# Patient Record
Sex: Male | Born: 1990 | Race: Black or African American | Hispanic: No | Marital: Married | Smoking: Current every day smoker
Health system: Southern US, Community
[De-identification: ages and names within clinical notes are randomized; demographics above are authoritative.]

## PROBLEM LIST (undated history)

## (undated) DIAGNOSIS — F419 Anxiety disorder, unspecified: Secondary | ICD-10-CM

## (undated) DIAGNOSIS — J40 Bronchitis, not specified as acute or chronic: Secondary | ICD-10-CM

## (undated) HISTORY — PX: HAND SURGERY: SHX662

## (undated) HISTORY — PX: TOE SURGERY: SHX1073

## (undated) HISTORY — PX: OTHER SURGICAL HISTORY: SHX169

---

## 2011-12-06 ENCOUNTER — Emergency Department (HOSPITAL_BASED_OUTPATIENT_CLINIC_OR_DEPARTMENT_OTHER)
Admission: EM | Admit: 2011-12-06 | Discharge: 2011-12-06 | Disposition: A | Discharge status: Home / Self Care | Payer: Self-pay | Attending: Emergency Medicine | Admitting: Emergency Medicine

## 2011-12-06 ENCOUNTER — Encounter (HOSPITAL_BASED_OUTPATIENT_CLINIC_OR_DEPARTMENT_OTHER): Payer: Self-pay | Admitting: *Deleted

## 2011-12-06 DIAGNOSIS — F17200 Nicotine dependence, unspecified, uncomplicated: Secondary | ICD-10-CM | POA: Insufficient documentation

## 2011-12-06 DIAGNOSIS — J069 Acute upper respiratory infection, unspecified: Secondary | ICD-10-CM

## 2011-12-06 DIAGNOSIS — R6883 Chills (without fever): Secondary | ICD-10-CM | POA: Insufficient documentation

## 2011-12-06 HISTORY — DX: Anxiety disorder, unspecified: F41.9

## 2011-12-06 HISTORY — DX: Bronchitis, not specified as acute or chronic: J40

## 2011-12-06 MED ORDER — HYDROCOD POLST-CHLORPHEN POLST 10-8 MG/5ML PO LQCR: 5.0000 mL | Freq: Two times a day (BID) | ORAL | Status: DC | PRN

## 2011-12-06 MED ORDER — AMOXICILLIN 500 MG PO CAPS: 500.0000 mg | ORAL_CAPSULE | Freq: Three times a day (TID) | ORAL | Status: AC

## 2011-12-06 NOTE — ED Notes (Signed)
Patient states he has had chills and sweats for the last 2-3 days.  States he recently has had sinus congestion.  Unknown if he has a fever.

## 2011-12-06 NOTE — Discharge Instructions (Signed)
Tussionex cough syrup as prescribed.  If not feeling better in 2-3 days, fill your prescription for amoxicillin.   Upper Respiratory Infection, Adult An upper respiratory infection (URI) is also sometimes known as the common cold. The upper respiratory tract includes the nose, sinuses, throat, trachea, and bronchi. Bronchi are the airways leading to the lungs. Most people improve within 1 week, but symptoms can last up to 2 weeks. A residual cough may last even longer.  CAUSES Many different viruses can infect the tissues lining the upper respiratory tract. The tissues become irritated and inflamed and often become very moist. Mucus production is also common. A cold is contagious. You can easily spread the virus to others by oral contact. This includes kissing, sharing a glass, coughing, or sneezing. Touching your mouth or nose and then touching a surface, which is then touched by another person, can also spread the virus. SYMPTOMS  Symptoms typically develop 1 to 3 days after you come in contact with a cold virus. Symptoms vary from person to person. They may include:  Runny nose.   Sneezing.   Nasal congestion.   Sinus irritation.   Sore throat.   Loss of voice (laryngitis).   Cough.   Fatigue.   Muscle aches.   Loss of appetite.   Headache.   Low-grade fever.  DIAGNOSIS  You might diagnose your own cold based on familiar symptoms, since most people get a cold 2 to 3 times a year. Your caregiver can confirm this based on your exam. Most importantly, your caregiver can check that your symptoms are not due to another disease such as strep throat, sinusitis, pneumonia, asthma, or epiglottitis. Blood tests, throat tests, and X-rays are not necessary to diagnose a common cold, but they may sometimes be helpful in excluding other more serious diseases. Your caregiver will decide if any further tests are required. RISKS AND COMPLICATIONS  You may be at risk for a more severe case of  the common cold if you smoke cigarettes, have chronic heart disease (such as heart failure) or lung disease (such as asthma), or if you have a weakened immune system. The very young and very old are also at risk for more serious infections. Bacterial sinusitis, middle ear infections, and bacterial pneumonia can complicate the common cold. The common cold can worsen asthma and chronic obstructive pulmonary disease (COPD). Sometimes, these complications can require emergency medical care and may be life-threatening. PREVENTION  The best way to protect against getting a cold is to practice good hygiene. Avoid oral or hand contact with people with cold symptoms. Wash your hands often if contact occurs. There is no clear evidence that vitamin C, vitamin E, echinacea, or exercise reduces the chance of developing a cold. However, it is always recommended to get plenty of rest and practice good nutrition. TREATMENT  Treatment is directed at relieving symptoms. There is no cure. Antibiotics are not effective, because the infection is caused by a virus, not by bacteria. Treatment may include:  Increased fluid intake. Sports drinks offer valuable electrolytes, sugars, and fluids.   Breathing heated mist or steam (vaporizer or shower).   Eating chicken soup or other clear broths, and maintaining good nutrition.   Getting plenty of rest.   Using gargles or lozenges for comfort.   Controlling fevers with ibuprofen or acetaminophen as directed by your caregiver.   Increasing usage of your inhaler if you have asthma.  Zinc gel and zinc lozenges, taken in the first 24 hours of  the common cold, can shorten the duration and lessen the severity of symptoms. Pain medicines may help with fever, muscle aches, and throat pain. A variety of non-prescription medicines are available to treat congestion and runny nose. Your caregiver can make recommendations and may suggest nasal or lung inhalers for other symptoms.  HOME  CARE INSTRUCTIONS   Only take over-the-counter or prescription medicines for pain, discomfort, or fever as directed by your caregiver.   Use a warm mist humidifier or inhale steam from a shower to increase air moisture. This may keep secretions moist and make it easier to breathe.   Drink enough water and fluids to keep your urine clear or pale yellow.   Rest as needed.   Return to work when your temperature has returned to normal or as your caregiver advises. You may need to stay home longer to avoid infecting others. You can also use a face mask and careful hand washing to prevent spread of the virus.  SEEK MEDICAL CARE IF:   After the first few days, you feel you are getting worse rather than better.   You need your caregiver's advice about medicines to control symptoms.   You develop chills, worsening shortness of breath, or brown or red sputum. These may be signs of pneumonia.   You develop yellow or brown nasal discharge or pain in the face, especially when you bend forward. These may be signs of sinusitis.   You develop a fever, swollen neck glands, pain with swallowing, or white areas in the back of your throat. These may be signs of strep throat.  SEEK IMMEDIATE MEDICAL CARE IF:   You have a fever.   You develop severe or persistent headache, ear pain, sinus pain, or chest pain.   You develop wheezing, a prolonged cough, cough up blood, or have a change in your usual mucus (if you have chronic lung disease).   You develop sore muscles or a stiff neck.  Document Released: 01/18/2001 Document Revised: 07/14/2011 Document Reviewed: 11/26/2010 [facility] Patient Information 2012 [place], [place].

## 2011-12-06 NOTE — ED Provider Notes (Signed)
History     CSN: [id number]  Arrival date & time 12/06/11  4098   First MD Initiated Contact with Patient 12/06/11 1119      Chief Complaint  Patient presents with  . Chills    (Consider location/radiation/quality/duration/timing/severity/associated sxs/prior treatment) Patient is a 21 y.o. male presenting with URI. The history is provided by the patient.  URI The primary symptoms include fever, fatigue and cough. Primary symptoms do not include nausea or vomiting. The current episode started 3 to 5 days ago. This is a new problem. The problem has been gradually worsening.  The onset of the illness is associated with exposure to sick contacts (kids at home with same). Symptoms associated with the illness include chills, plugged ear sensation, sinus pressure and congestion.    Past Medical History  Diagnosis Date  . Bronchitis   . Anxiety     Past Surgical History  Procedure Date  . Toe surgery   . Hyperspadis   . Hand surgery     No family history on file.  History  Substance Use Topics  . Smoking status: Current Everyday Smoker    Types: Cigarettes  . Smokeless tobacco: Not on file  . Alcohol Use: No      Review of Systems  Constitutional: Positive for fever, chills and fatigue.  HENT: Positive for congestion and sinus pressure.   Respiratory: Positive for cough.   Gastrointestinal: Negative for nausea and vomiting.  All other systems reviewed and are negative.    Allergies  Review of patient's allergies indicates no known allergies.  Home Medications  No current outpatient prescriptions on file.  BP 150/69  Pulse 83  Temp(Src) 98.2 F (36.8 C) (Oral)  Resp 20  Ht 6\' 1"  (1.854 m)  Wt 287 lb (130.182 kg)  BMI 37.86 kg/m2  SpO2 98%  Physical Exam  Nursing note and vitals reviewed. Constitutional: He is oriented to person, place, and time. He appears well-developed and well-nourished. No distress.  HENT:  Head: Normocephalic and atraumatic.    Right Ear: External ear normal.  Left Ear: External ear normal.  Mouth/Throat: Oropharynx is clear and moist.  Neck: Normal range of motion.  Cardiovascular: Normal rate and regular rhythm.   Pulmonary/Chest: Effort normal. No respiratory distress.  Abdominal: Soft. Bowel sounds are normal. He exhibits distension.  Musculoskeletal: Normal range of motion. He exhibits no edema.  Lymphadenopathy:    He has no cervical adenopathy.  Neurological: He is alert and oriented to person, place, and time.  Skin: Skin is warm and dry. He is not diaphoretic.    ED Course  Procedures (including critical care time)  Labs Reviewed - No data to display No results found.   No diagnosis found.    MDM          Geoffery Lyons, MD 12/06/11 [phone]

## 2011-12-15 ENCOUNTER — Encounter (HOSPITAL_BASED_OUTPATIENT_CLINIC_OR_DEPARTMENT_OTHER): Payer: Self-pay | Admitting: *Deleted

## 2011-12-15 ENCOUNTER — Emergency Department (HOSPITAL_BASED_OUTPATIENT_CLINIC_OR_DEPARTMENT_OTHER)
Admission: EM | Admit: 2011-12-15 | Discharge: 2011-12-15 | Disposition: A | Discharge status: Home / Self Care | Payer: Self-pay | Attending: Emergency Medicine | Admitting: Emergency Medicine

## 2011-12-15 ENCOUNTER — Emergency Department (INDEPENDENT_AMBULATORY_CARE_PROVIDER_SITE_OTHER): Payer: Self-pay

## 2011-12-15 DIAGNOSIS — N4340 Spermatocele of epididymis, unspecified: Secondary | ICD-10-CM | POA: Insufficient documentation

## 2011-12-15 DIAGNOSIS — N342 Other urethritis: Secondary | ICD-10-CM | POA: Insufficient documentation

## 2011-12-15 DIAGNOSIS — R300 Dysuria: Secondary | ICD-10-CM

## 2011-12-15 DIAGNOSIS — N509 Disorder of male genital organs, unspecified: Secondary | ICD-10-CM

## 2011-12-15 LAB — URINE MICROSCOPIC-ADD ON

## 2011-12-15 LAB — URINALYSIS, ROUTINE W REFLEX MICROSCOPIC
Glucose, UA: NEGATIVE mg/dL
Ketones, ur: NEGATIVE mg/dL
Protein, ur: NEGATIVE mg/dL
Urobilinogen, UA: 0.2 mg/dL (ref 0.0–1.0)

## 2011-12-15 MED ORDER — HYDROCODONE-ACETAMINOPHEN 5-500 MG PO TABS: 1.0000 | ORAL_TABLET | Freq: Four times a day (QID) | ORAL | Status: AC | PRN

## 2011-12-15 MED ORDER — HYDROCODONE-ACETAMINOPHEN 5-325 MG PO TABS
2.0000 | ORAL_TABLET | Freq: Once | ORAL | Status: AC
  Administered 2011-12-15: 2 via ORAL

## 2011-12-15 MED ORDER — AZITHROMYCIN 250 MG PO TABS
1000.0000 mg | ORAL_TABLET | Freq: Once | ORAL | Status: AC
  Administered 2011-12-15: 1000 mg via ORAL
  Filled 2011-12-15: qty 1
  Filled 2011-12-15: qty 3

## 2011-12-15 MED ORDER — HYDROCODONE-ACETAMINOPHEN 5-325 MG PO TABS
ORAL_TABLET | ORAL | Status: AC
  Administered 2011-12-15: 2 via ORAL
  Filled 2011-12-15: qty 2

## 2011-12-15 MED ORDER — CIPROFLOXACIN HCL 500 MG PO TABS: 500.0000 mg | ORAL_TABLET | Freq: Two times a day (BID) | ORAL | Status: AC

## 2011-12-15 MED ORDER — CEFTRIAXONE SODIUM 250 MG IJ SOLR
250.0000 mg | Freq: Once | INTRAMUSCULAR | Status: AC
  Administered 2011-12-15: 250 mg via INTRAMUSCULAR
  Filled 2011-12-15: qty 250

## 2011-12-15 NOTE — ED Notes (Signed)
Pt awoke this am with pain in genitals, which increases with urination, pt states that he does have pain in his lower back,

## 2011-12-15 NOTE — ED Notes (Signed)
Woke with hematuria and urgency.

## 2011-12-15 NOTE — ED Provider Notes (Signed)
History     CSN: [id number]  Arrival date & time 12/15/11  1924   First MD Initiated Contact with Patient 12/15/11 1937      No chief complaint on file.   (Consider location/radiation/quality/duration/timing/severity/associated sxs/prior treatment) HPI Comments: Pt is c/o hematuria and urgency and testicular pain that started this morning:pt denies any pain in abdomen or back:pt denies fever, nausea, vomiting or diarrhea:pt denies any penile discharge prior to the blood in urine noted today  The history is provided by the patient. No language interpreter was used.    Past Medical History  Diagnosis Date  . Bronchitis   . Anxiety     Past Surgical History  Procedure Date  . Toe surgery   . Hyperspadis   . Hand surgery     No family history on file.  History  Substance Use Topics  . Smoking status: Current Everyday Smoker    Types: Cigarettes  . Smokeless tobacco: Not on file  . Alcohol Use: No      Review of Systems  Constitutional: Negative.   Respiratory: Negative.   Cardiovascular: Negative.     Allergies  Review of patient's allergies indicates no known allergies.  Home Medications   Current Outpatient Rx  Name Route Sig Dispense Refill  . AMOXICILLIN 500 MG PO CAPS Oral Take 1 capsule (500 mg total) by mouth 3 (three) times daily. 21 capsule 0    BP 118/66  Pulse 92  Temp(Src) 99.5 F (37.5 C) (Oral)  Resp 20  SpO2 100%  Physical Exam  Nursing note and vitals reviewed. Constitutional: He appears well-developed and well-nourished.  Cardiovascular: Normal rate and regular rhythm.   Pulmonary/Chest: Effort normal and breath sounds normal.  Abdominal: Soft.  Genitourinary: Left testis shows tenderness.  Musculoskeletal: Normal range of motion.  Skin: Skin is dry.    ED Course  Procedures (including critical care time)  Labs Reviewed  URINALYSIS, ROUTINE W REFLEX MICROSCOPIC - Abnormal; Notable for the following:    APPearance CLOUDY (*)     Hgb urine dipstick LARGE (*)    Leukocytes, UA LARGE (*)    All other components within normal limits  URINE MICROSCOPIC-ADD ON  GC/CHLAMYDIA PROBE AMP, GENITAL   US Scrotum  12/15/2011  *RADIOLOGY REPORT*  Clinical Data: Left vestibular pain  SCROTAL ULTRASOUND Technique:  Complete ultrasound examination of the testicles, epididymis, and other scrotal structures was performed.  Color Doppler ultrasound was also utilized to evaluate blood flow to the testicles.  Comparison:  None.  Findings: Left testicle measures 4.7 x 1.9 x 3.6 cm.  The left testicle measures 4.4 x 2.1 x 3.1 cm. Testicular echotexture is homogeneous bilaterally.  No intratesticular mass lesion.  No microlithiasis.  2 mm epididymal cyst or spermatocele is seen in the left epididymal tissues.  The right epididymis is unremarkable.  There is no hydrocele on either side.  The venous plexus is slightly prominent bilaterally without overt evidence for varicocele.  IMPRESSION: Sonographically normal appearance of the testicles.  Color Doppler signal is symmetric within the testicular parenchyma.  Original Report Authenticated By: ERIC A. MANSELL, M.D.     1. Spermatocele   2. Urethritis       MDM  Discussed findings with pt:pt treated and tested for std here:educated pt on WUJ:WJXB send home with urology follow up and something for pain:considered kidney stone but pts pain is localized to scrotum:no flank or abdominal pain        Teressa Lower, NP 12/15/11 2122

## 2011-12-15 NOTE — Discharge Instructions (Signed)
Urethritis, Adult  Urethritis is an inflammation (soreness) of the urethra (the tube exiting from the bladder). It is often caused by germs that may be spread through sexual contact.  TREATMENT   Urethritis will usually respond to antibiotics. These are medications that kill germs. Take all the medicine given to you. You may feel better in a couple days, but TAKE ALL MEDICINE or the infection may not be completely cured and may become more difficult to treat. Response can generally be expected in 7 to 10 days. You may require additional treatment after more testing.  HOME CARE INSTRUCTIONS   Not have sex until the test results are known and treatment is completed.   Know that you may be asked to notify your sex partner when your final test results are back.   Finish all medications as prescribed.   Prevent sexually transmitted infections including AIDS. Practice safe sex. Use condoms.  SEEK MEDICAL CARE IF:    Your symptoms are not improved in 2 to 3 days.   Your symptoms are getting worse.   Your develop abdominal pain.   You develop joint pain.  SEEK IMMEDIATE MEDICAL CARE IF:    You have a fever.   You develop severe pain in the belly, back or side.   You develop repeated vomiting.  TEST RESULTS  Not all test results are available during your visit. If your test results are not back during the visit, make an appointment with your caregiver to find out the results. Do not assume everything is normal if you have not heard from your caregiver or the medical facility. It is important for you to follow-up on all of your test results.  Document Released: 01/18/2001 Document Revised: 07/14/2011 Document Reviewed: 08/10/2009  [employer] Patient Information 2012 [employer].

## 2011-12-15 NOTE — ED Provider Notes (Signed)
Medical screening examination/treatment/procedure(s) were performed by non-physician practitioner and as supervising physician I was immediately available for consultation/collaboration.  Toy Baker, MD 12/15/11 2212

## 2011-12-16 LAB — GC/CHLAMYDIA PROBE AMP, GENITAL: GC Probe Amp, Genital: NEGATIVE

## 2013-03-23 ENCOUNTER — Encounter (HOSPITAL_COMMUNITY): Payer: Self-pay | Admitting: Emergency Medicine

## 2013-03-23 ENCOUNTER — Emergency Department (HOSPITAL_COMMUNITY)
Admission: EM | Admit: 2013-03-23 | Discharge: 2013-03-23 | Discharge status: Against Medical Advice | Payer: Self-pay | Attending: Emergency Medicine | Admitting: Emergency Medicine

## 2013-03-23 DIAGNOSIS — Y9389 Activity, other specified: Secondary | ICD-10-CM | POA: Insufficient documentation

## 2013-03-23 DIAGNOSIS — F17200 Nicotine dependence, unspecified, uncomplicated: Secondary | ICD-10-CM | POA: Insufficient documentation

## 2013-03-23 DIAGNOSIS — Y92410 Unspecified street and highway as the place of occurrence of the external cause: Secondary | ICD-10-CM | POA: Insufficient documentation

## 2013-03-23 DIAGNOSIS — S79919A Unspecified injury of unspecified hip, initial encounter: Secondary | ICD-10-CM | POA: Insufficient documentation
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
## 2014-04-04 ENCOUNTER — Encounter (HOSPITAL_BASED_OUTPATIENT_CLINIC_OR_DEPARTMENT_OTHER): Payer: Self-pay | Admitting: Emergency Medicine

## 2014-04-04 ENCOUNTER — Emergency Department (HOSPITAL_BASED_OUTPATIENT_CLINIC_OR_DEPARTMENT_OTHER)
Admission: EM | Admit: 2014-04-04 | Discharge: 2014-04-04 | Disposition: A | Discharge status: Home / Self Care | Payer: Self-pay | Attending: Emergency Medicine | Admitting: Emergency Medicine

## 2014-04-04 DIAGNOSIS — Z8709 Personal history of other diseases of the respiratory system: Secondary | ICD-10-CM | POA: Insufficient documentation

## 2014-04-04 DIAGNOSIS — F17200 Nicotine dependence, unspecified, uncomplicated: Secondary | ICD-10-CM | POA: Insufficient documentation

## 2014-04-04 DIAGNOSIS — R630 Anorexia: Secondary | ICD-10-CM | POA: Insufficient documentation

## 2014-04-04 DIAGNOSIS — Z8659 Personal history of other mental and behavioral disorders: Secondary | ICD-10-CM | POA: Insufficient documentation

## 2014-04-04 DIAGNOSIS — R42 Dizziness and giddiness: Secondary | ICD-10-CM | POA: Insufficient documentation

## 2014-04-04 DIAGNOSIS — R51 Headache: Secondary | ICD-10-CM | POA: Insufficient documentation

## 2014-04-04 LAB — CBG MONITORING, ED: Glucose-Capillary: 89 mg/dL (ref 70–99)

## 2014-04-04 NOTE — ED Provider Notes (Signed)
CSN: [id number]     Arrival date & time 04/04/14  6195 History  This chart was scribed for Elwin Mocha, MD by Bronson Curb, ED Scribe. This patient was seen in room MH08/MH08 and the patient's care was started at 9:01 PM.    Chief Complaint  Patient presents with  . Dizziness      Patient is a 23 y.o. male presenting with dizziness. The history is provided by the patient. No language interpreter was used.  Dizziness Description: "woozy" Severity:  Moderate Onset quality:  Sudden Progression:  Improving Chronicity:  New Relieved by:  Nothing Ineffective treatments:  Lying down Associated symptoms: headaches   Associated symptoms: no blood in stool, no nausea, no tinnitus, no vision changes and no vomiting     HPI Comments: Samuel Weeks is a 23 y.o. male who presents to the Emergency Department complaining of dizziness onset this morning. Patient states he woke up this morning, and felt "woozy" throughout the day. He reports he did not eat until 1 PM and took a 4 hour nap, however, he states his symptoms still persist. Patient states he did keep late hours the night before and had 1 beer. Patient also reports tenderness behind his right ear and reports prior swelling to the area that has since resolved. Patient states he stays hydrated. He denies fever, tinnitus, sore throat, rhinorrhea, visual disturbances, numbness/weakness of extremities, bowel/bladder symptoms, or abdominal pain. Patient has history of bronchitis and anxiety. He also reports family history of DM.  Patient is not established with a PCP   Past Medical History  Diagnosis Date  . Bronchitis   . Anxiety    Past Surgical History  Procedure Laterality Date  . Toe surgery    . Hyperspadis    . Hand surgery     No family history on file. History  Substance Use Topics  . Smoking status: Current Every Day Smoker    Types: Cigarettes  . Smokeless tobacco: Not on file  . Alcohol Use: Yes     Comment: occ     Review of Systems  Constitutional: Negative for fever and chills.  HENT: Negative for tinnitus.   Gastrointestinal: Negative for nausea, vomiting and blood in stool.  Neurological: Positive for dizziness and headaches.  All other systems reviewed and are negative.     Allergies  Review of patient's allergies indicates no known allergies.  Home Medications   Prior to Admission medications   Not on File   Triage Vitals: BP 133/92  Pulse 70  Temp(Src) 97.8 F (36.6 C) (Oral)  Ht  (1.854 m)  Wt 285 lb (129.275 kg)  BMI 37.61 kg/m2  SpO2 100%  Physical Exam  Nursing note and vitals reviewed. Constitutional: He is oriented to person, place, and time. He appears well-developed and well-nourished. No distress.  HENT:  Head: Normocephalic and atraumatic.  Right Ear: Hearing normal.  Left Ear: Hearing normal.  Nose: Nose normal.  Mouth/Throat: Oropharynx is clear and moist and mucous membranes are normal.  Eyes: Conjunctivae and EOM are normal. Pupils are equal, round, and reactive to light.  Neck: Normal range of motion. Neck supple.  Cardiovascular: Regular rhythm, S1 normal and S2 normal.  Exam reveals no gallop and no friction rub.   No murmur heard. Pulmonary/Chest: Effort normal and breath sounds normal. No respiratory distress. He exhibits no tenderness.  Abdominal: Soft. Normal appearance and bowel sounds are normal. There is no hepatosplenomegaly. There is no tenderness. There is no rebound, no  guarding, no tenderness at McBurney's point and negative Murphy's sign. No hernia.  Musculoskeletal: Normal range of motion.  Neurological: He is alert and oriented to person, place, and time. He has normal strength. No cranial nerve deficit or sensory deficit. Coordination normal. GCS eye subscore is 4. GCS verbal subscore is 5. GCS motor subscore is 6.  Skin: Skin is warm, dry and intact. No rash noted. No cyanosis.  Psychiatric: He has a normal mood and affect. His  speech is normal and behavior is normal. Thought content normal.    ED Course  Procedures (including critical care time)  DIAGNOSTIC STUDIES: Oxygen Saturation is 100% on room air, normal by my interpretation.    COORDINATION OF CARE: 9:13 PM- Pt advised of plan for treatment and pt agrees.   Labs Review Labs Reviewed - No data to display  Imaging Review No results found.   EKG Interpretation None      MDM   Final diagnoses:  Dizziness    23 year old male here with dizziness. Constant all day. Slept for 4 hours without relief. No fevers, vomiting, diarrhea. Decreased appetite today.  Exam benign. No medical problems. Offered labs, fluids the patient does not want to stay he would like to leave. Given resource guide for followup. Instructed to followup with PCP if problems persist. Stable for discharge  I personally performed the services described in this documentation, which was scribed in my presence. The recorded information has been reviewed and is accurate.     Elwin Mocha, MD 04/04/14 2325

## 2014-04-04 NOTE — Discharge Instructions (Signed)

## 2014-04-04 NOTE — ED Notes (Signed)
MD at bedside. 

## 2014-04-04 NOTE — ED Notes (Signed)
Dizziness since this am. Headache.

## 2014-09-26 ENCOUNTER — Emergency Department (HOSPITAL_BASED_OUTPATIENT_CLINIC_OR_DEPARTMENT_OTHER)
Admission: EM | Admit: 2014-09-26 | Discharge: 2014-09-26 | Disposition: A | Discharge status: Home / Self Care | Payer: Self-pay | Attending: Emergency Medicine | Admitting: Emergency Medicine

## 2014-09-26 ENCOUNTER — Encounter (HOSPITAL_BASED_OUTPATIENT_CLINIC_OR_DEPARTMENT_OTHER): Payer: Self-pay

## 2014-09-26 DIAGNOSIS — Z720 Tobacco use: Secondary | ICD-10-CM | POA: Insufficient documentation

## 2014-09-26 DIAGNOSIS — Z8659 Personal history of other mental and behavioral disorders: Secondary | ICD-10-CM | POA: Insufficient documentation

## 2014-09-26 DIAGNOSIS — Z8709 Personal history of other diseases of the respiratory system: Secondary | ICD-10-CM | POA: Insufficient documentation

## 2014-09-26 DIAGNOSIS — M5417 Radiculopathy, lumbosacral region: Secondary | ICD-10-CM | POA: Insufficient documentation

## 2014-09-26 NOTE — ED Notes (Signed)
Back pain that started yesterday while lying in bed.  [facility] but did not fill it.

## 2014-09-26 NOTE — ED Provider Notes (Signed)
CSN: [id number]     Arrival date & time 09/26/14  1652 History   First MD Initiated Contact with Patient 09/26/14 1721     Chief Complaint  Patient presents with  . Back Pain     (Consider location/radiation/quality/duration/timing/severity/associated sxs/prior Treatment) HPI Comments: Patient is a 24 year old male who presents with complaints of low back pain. He states he was lying in bed yesterday evening and reached for an object, causing him to feel a pull in his back. Since that time he has been uncomfortable and feels as though his back is "crooked". He describes radiation into his right buttock and thigh but denies any weakness or numbness. He denies any bowel or bladder complaints.  Patient is a 24 y.o. male presenting with back pain. The history is provided by the patient.  Back Pain Location:  Lumbar spine Quality:  Stabbing Radiates to:  R posterior upper leg Pain severity:  Moderate Onset quality:  Sudden Duration:  1 day Timing:  Constant Progression:  Unchanged Chronicity:  New Context: not falling   Relieved by:  Nothing Worsened by:  Bending, movement and palpation Ineffective treatments:  None tried   Past Medical History  Diagnosis Date  . Bronchitis   . Anxiety    Past Surgical History  Procedure Laterality Date  . Toe surgery    . Hyperspadis    . Hand surgery     No family history on file. History  Substance Use Topics  . Smoking status: Current Every Day Smoker    Types: Cigarettes  . Smokeless tobacco: Not on file  . Alcohol Use: Yes     Comment: occ    Review of Systems  Musculoskeletal: Positive for back pain.  All other systems reviewed and are negative.     Allergies  Review of patient's allergies indicates no known allergies.  Home Medications   Prior to Admission medications   Not on File   BP 142/80 mmHg  Pulse 99  Temp(Src) 99.8 F (37.7 C) (Oral)  Resp 18  Ht  (1.854 m)  Wt 312 lb (141.522 kg)  BMI 41.17  kg/m2  SpO2 98% Physical Exam  Constitutional: He is oriented to person, place, and time. He appears well-developed and well-nourished. No distress.  HENT:  Head: Normocephalic and atraumatic.  Neck: Normal range of motion. Neck supple.  Musculoskeletal:  There is tenderness to palpation in the soft tissues of the right lumbar region. There is no bony tenderness and no step-off.  Neurological: He is alert and oriented to person, place, and time.  DTRs are trace and symmetrical in the bilateral lower extremities. Strength is 5 out of 5 and he is able to ambulate on his heels and toes without difficulty.  Skin: Skin is warm and dry. He is not diaphoretic.  Nursing note and vitals reviewed.   ED Course  Procedures (including critical care time) Labs Review Labs Reviewed - No data to display  Imaging Review No results found.   EKG Interpretation None      MDM   Final diagnoses:  None    Patient presents with complaints of radicular low back pain that started yesterday evening. He is neurologically intact and I see no red flags that warrant emergent imaging. He was seen earlier today at [facility], however was unsatisfied with the treatment he was provided there. He was prescribed prednisone and Valium, however has not filled these. In discussion with the patient, he will fill the prescriptions and  give this time. He is to follow-up with his primary Dr. if not improving in the next week, and return to the ER if his symptoms substantially worsen or change. He was offered pain medication here in the ER, however has declined.    Geoffery Lyonsouglas Pietrina Jagodzinski, MD 09/26/14 [phone]

## 2014-09-26 NOTE — Discharge Instructions (Signed)
Prednisone as prescribed previously. Valium as prescribed previously.  Follow-up with your primary Dr. if not improving in the next week, and return to the ER if your symptoms substantially worsen or change.   Lumbosacral Radiculopathy Lumbosacral radiculopathy is a pinched nerve or nerves in the low back (lumbosacral area). When this happens you may have weakness in your legs and may not be able to stand on your toes. You may have pain going down into your legs. There may be difficulties with walking normally. There are many causes of this problem. Sometimes this may happen from an injury, or simply from arthritis or boney problems. It may also be caused by other illnesses such as diabetes. If there is no improvement after treatment, further studies may be done to find the exact cause. DIAGNOSIS  X-rays may be needed if the problems become long standing. Electromyograms may be done. This study is one in which the working of nerves and muscles is studied. HOME CARE INSTRUCTIONS   Applications of ice packs may be helpful. Ice can be used in a plastic bag with a towel around it to prevent frostbite to skin. This may be used every 2 hours for 20 to 30 minutes, or as needed, while awake, or as directed by your caregiver.  Only take over-the-counter or prescription medicines for pain, discomfort, or fever as directed by your caregiver.  If physical therapy was prescribed, follow your caregiver's directions. SEEK IMMEDIATE MEDICAL CARE IF:   You have pain not controlled with medications.  You seem to be getting worse rather than better.  You develop increasing weakness in your legs.  You develop loss of bowel or bladder control.  You have difficulty with walking or balance, or develop clumsiness in the use of your legs.  You have a fever. MAKE SURE YOU:   Understand these instructions.  Will watch your condition.  Will get help right away if you are not doing well or get worse. Document  Released: 07/25/2005 Document Revised: 10/17/2011 Document Reviewed: 03/14/2008 [facility] Patient Information 2015 [place], [place]. This information is not intended to replace advice given to you by your health care provider. Make sure you discuss any questions you have with your health care provider.

## 2014-09-26 NOTE — ED Notes (Signed)
[facility] given rx but has not filled the medication. Reports pain has continued and he feels like it needs to be xrayed.

## 2015-04-18 ENCOUNTER — Encounter (HOSPITAL_BASED_OUTPATIENT_CLINIC_OR_DEPARTMENT_OTHER): Payer: Self-pay | Admitting: Emergency Medicine

## 2015-04-18 ENCOUNTER — Emergency Department (HOSPITAL_BASED_OUTPATIENT_CLINIC_OR_DEPARTMENT_OTHER)
Admission: EM | Admit: 2015-04-18 | Discharge: 2015-04-18 | Disposition: A | Discharge status: Home / Self Care | Payer: Self-pay | Attending: Emergency Medicine | Admitting: Emergency Medicine

## 2015-04-18 DIAGNOSIS — Z720 Tobacco use: Secondary | ICD-10-CM | POA: Insufficient documentation

## 2015-04-18 DIAGNOSIS — Z8709 Personal history of other diseases of the respiratory system: Secondary | ICD-10-CM | POA: Insufficient documentation

## 2015-04-18 DIAGNOSIS — Z8659 Personal history of other mental and behavioral disorders: Secondary | ICD-10-CM | POA: Insufficient documentation

## 2015-04-18 DIAGNOSIS — R229 Localized swelling, mass and lump, unspecified: Secondary | ICD-10-CM

## 2015-04-18 DIAGNOSIS — R220 Localized swelling, mass and lump, head: Secondary | ICD-10-CM | POA: Insufficient documentation

## 2015-04-18 NOTE — Discharge Instructions (Signed)
1 to 2 tablets of 25 mg Benadryl pills every 4-6 hours as needed to a maximum of 300 mg per day.   Also apply ice to the affected area.   Do not hesitate to return to the emergency room for any new, worsening or concerning symptoms.  Please obtain primary care using resource guide below. Let them know that you were seen in the emergency room and that they will need to obtain records for further outpatient management.    Emergency Department Resource Guide 1) Find a Doctor and Pay Out of Pocket Although you won't have to find out who is covered by your insurance plan, it is a good idea to ask around and get recommendations. You will then need to call the office and see if the doctor you have chosen will accept you as a new patient and what types of options they offer for patients who are self-pay. Some doctors offer discounts or will set up payment plans for their patients who do not have insurance, but you will need to ask so you aren't surprised when you get to your appointment.  2) Contact Your Local Health Department Not all health departments have doctors that can see patients for sick visits, but many do, so it is worth a call to see if yours does. If you don't know where your local health department is, you can check in your phone book. The CDC also has a tool to help you locate your state's health department, and many state websites also have listings of all of their local health departments.  3) Find a Walk-in Clinic If your illness is not likely to be very severe or complicated, you may want to try a walk in clinic. These are popping up all over the country in pharmacies, drugstores, and shopping centers. They're usually staffed by nurse practitioners or physician assistants that have been trained to treat common illnesses and complaints. They're usually fairly quick and inexpensive. However, if you have serious medical issues or chronic medical problems, these are probably not your best  option.  No Primary Care Doctor: - Call Health Connect at  [phone] - they can help you locate a primary care doctor that  accepts your insurance, provides certain services, etc. - Physician Referral Service- [phone]  Chronic Pain Problems: Organization         Address  Phone   Notes  [facility]  [phone] Patients need to be referred by their primary care doctor.   Medication Assistance: Organization         Address  Phone   Notes  [facility] [address] [place] [place], [place] [address] [phone] --Must be a resident of [facility] -- Must have NO insurance coverage whatsoever (no Medicaid/ Medicare, etc.) -- The pt. MUST have a primary care doctor that directs their care regularly and follows them in the community   [facility]  [phone]   [employer]  [phone]    Agencies that provide inexpensive medical care: Organization         Address  Phone   Notes  [facility]  [phone]   [facility]    [phone]   [facility] [address] [place], [place] [address] [phone]   [facility] [address] [place] [place] [phone]   Planned Parenthood    [phone]   [facility]    [phone]   [facility]  [address], [place] Phone:  [phone], Fax:  [phone] Hours of Operation:  9 am - 6 pm, M-F.  Also accepts Medicaid/Medicare and self-pay.  [facility]  [address], [place] Phone: [phone], Fax: [phone]. Hours of Operation:  8:30 am - 5:30 pm, M-F.  Also accepts Medicaid and self-pay.  [facility] [place] [address], [place] Phone: [phone]   [facility] [address] Natasha Bence [place], [place] [phone], Ext. [phone] Mondays & Thursdays: 7-9 AM.  First 15  patients are seen on a first come, first serve basis.    Medicaid-accepting [facility] Providers:  Organization         Address  Phone   Notes  [facility] [address], [place] [phone] Also accepts self-pay patients.  [facility] [address] Laurell Josephs [place], [place]  [phone]   [facility] [address], [place] [phone]   [facility] [address], [place] [phone]   Renaye Rakers [address], [place]   [phone] Only accepts [place] Access [place] patients after they have their name applied to their card.   Self-Pay (no insurance) in [facility]:  Organization         Address  Phone   Notes  Sickle Cell Patients, [facility] [address] [address] [place], [place] [phone]   [facility] [address] [place], [place] [phone]   [facility] [place]  [address], [place] [phone]   [facility]/Dr. Osei-Bonsu  [address], [place] or [address], [place] [phone] Phone number for both [place] and [place] locations is the same.  [facility] [address] [place] [phone]   [facility] [address] [place] or [address] [phone]   [facility] [address], [place] [phone], phone; [phone], fax Sees patients 1st and 3rd Saturday of every month.  Must not qualify for public or private insurance (i.e. Medicaid, Medicare, [place] Health Choice, Veterans' Benefits)  Household income should be no more than 200% of the poverty level The clinic cannot treat you if you are pregnant or think you are pregnant  Sexually transmitted diseases are not treated at the clinic.    Dental  Care: Organization         Address  Phone  Notes  [facility] [address] [place], [place] [phone] Accepts children up to age 47 who are enrolled in [place] or [place] Health Choice; pregnant women with a Medicaid card; and children who have applied for Medicaid or [place] Health Choice, but were declined, whose parents can pay a reduced fee at time of service.  [facility]  [address], [place] [phone] Accepts children up to age 59 who are enrolled in [place] or [place] Health Choice; pregnant women with a Medicaid card; and children who have applied for Medicaid or [place] Health Choice, but were declined, whose parents can pay a reduced fee at time of service.  [facility]  [address], [place] [phone]) [phone] Patients are seen by appointment only. Walk-ins are not accepted. [facility] will see patients 25 years of age and older. Monday - Tuesday (8am-5pm) Most Wednesdays (8:30-5pm) $30 per visit, cash only  [facility]  [address], [facility] [place] [phone]) [phone] Patients are seen by appointment only. Walk-ins are not accepted. [facility] will see patients 25 years of age and older. One Wednesday Evening (Monthly: Volunteer Based).  $30 per visit, cash only  [facility]  [phone]) [phone] for adults; Children under age 254, call Graduate Pediatric Dentistry at [phone]) [phone]. Children aged 374-14, please call [phone]) [phone] to request a pediatric application.  Dental services are provided in all areas of dental care including fillings, crowns and bridges, complete and partial dentures, implants, gum treatment, root canals, and extractions. Preventive care is also provided. Treatment is provided to both adults and children. Patients are selected via a lottery and there is often a waiting list.   [facility] [address], [place]  [phone]) [phone] [email or web address]   [facility] [address], [place], [place] [phone], Ext. [phone] Second and Fourth Thursday of each month, opens at 6:30 AM; Clinic ends at 9 AM.  Patients are seen on a first-come first-served basis, and a limited number are seen during each clinic.   [facility]  [address] [place] [address], [place], [place] [phone]) [phone]   Eligibility Requirements You must have lived in [place], [place], or [place] counties for at least the last three months.   You cannot be eligible for state or federal sponsored [place] Cityhealthcare insurance, including CIGNAVeterans Administration, [place]icaid, or [employer] EntertainmentMedicare.   You generally cannot be eligible for healthcare insurance through your employer.    How to apply: Eligibility screenings are held every Tuesday and Wednesday afternoon from 1:00 pm until 4:00 pm. You do not need an appointment for the interview!  [facility] [address], [place], [place] [phone]   [facility]  [phone]   [facility]  [phone]   [facility]  [phone]    Behavioral Health Resources in the Community: Intensive Outpatient Programs Organization         Address  Phone  Notes  [facility] [address], [place], [place] [phone]   [facility] [address], [place], [place] [phone]   [facility] [address], [place], [place]  [phone]   [facility] [address],  [place], [place] [phone] or [phone]   Substance Abuse Resources Organization         Address  Phone  Notes  [facility]  [phone]   [facility]  [phone]   [facility]  [phone]   [facility]  [phone]   Residential & Outpatient Substance Abuse Program  [phone]    Psychological Services Organization         Address  Phone  Notes  [facility]  [phone]- [phone]   [facility]  [phone]- [phone]   [facility] [address], [place] [phone] or [phone]    Mobile Crisis Teams Organization         Address  Phone  Notes  [facility], Mobile Crisis Care Unit  [phone]   [facility]  [address] Ginette [place], [place] [phone]   [facility] [address] [place] [phone]    Self-Help/Support Groups Organization         Address  Phone             Notes  [facility] [place] - variety of support groups  336- I7437963 Call for more information  Narcotics Anonymous (NA), [facility] [address], [employer] [place]  2 meetings at this location   Statistician         Address  Phone  Notes  [facility] [address],    [place]  [phone]   [facility]  [address] [place] [address], [place], [place] [phone]   [facility] [address] [place], [place] [phone] Admissions: 8am-3pm M-F  [facility] [address]    [place], [place] [phone]   [facility] [address] [place], [place], [place] [phone]   [facility] [address]  [place], [place] [phone]   [facility] - Intensive Outpatient [address], [place], [place] [phone]   [facility] ([facility] [address] [place].,  [place], [place] [phone] or [phone]   [facility] ([facility]) [address], [place], [place] [phone] Accepts Medicaid  [facility] [address],  [place] [phone] Substance Abuse/Addiction Treatment   [facility] Organization         Address  Phone  Notes  [facility]  [phone]   Angie Fava, PhD [address] Ervin Knack [place], [place]   [phone] or [phone]   [facility]al   [address] [place], [place] [phone]   [facility] [address] [place], [place] [phone] Insurance/Medicaid/sponsorship through [facility] [address]                                    [place], [place] [phone] Therapy/tele-psych/case  [facility] [address], [place] [phone]    Dr. Lolly Mustache  [phone]   [facility]  [facility] Dept. 1) [address], [place] 2) [address] [place] 3)  [address], [place] [phone]  [phone]   [facility] Child Abuse Hotline [phone] or 2694474601 (After Hours)

## 2015-04-18 NOTE — ED Notes (Signed)
Patient reports that he no longer wants to stay and refuses any further vitals or care. Would like to be d/c'd up set that he had to wait so long.

## 2015-04-18 NOTE — ED Provider Notes (Signed)
CSN: [id number]     Arrival date & time 04/18/15  1221 History   First MD Initiated Contact with Patient 04/18/15 1410     Chief Complaint  Patient presents with  . Facial Swelling     (Consider location/radiation/quality/duration/timing/severity/associated sxs/prior Treatment) HPI   Blood pressure 132/68, pulse 77, temperature 98.4 F (36.9 C), temperature source Oral, resp. rate 16, height 6\' 1"  (1.854 m), weight 305 lb (138.347 kg), SpO2 98 %.  Samuel Weeks is a 24 y.o. male complaining of swelling to left side of 4 head which she noticed 3 days ago. States that swelling has improved and this is the best it has been (note that this contradicts triage note) patient has been taking Benadryl and took an amoxicillin which is left over from his mother. States that the area is pruritic but not painful. He states that there  was some slight swelling to the eyelid. He denies change in vision, pain with eye movement, fever, chills.  Past Medical History  Diagnosis Date  . Bronchitis   . Anxiety    Past Surgical History  Procedure Laterality Date  . Toe surgery    . Hyperspadis    . Hand surgery     No family history on file. Social History  Substance Use Topics  . Smoking status: Current Every Day Smoker    Types: Cigarettes  . Smokeless tobacco: None  . Alcohol Use: Yes     Comment: occ    Review of Systems  10 systems reviewed and found to be negative, except as noted in the HPI.   Allergies  Review of patient's allergies indicates no known allergies.  Home Medications   Prior to Admission medications   Not on File   BP 132/68 mmHg  Pulse 77  Temp(Src) 98.4 F (36.9 C) (Oral)  Resp 16  Ht 6\' 1"  (1.854 m)  Wt 305 lb (138.347 kg)  BMI 40.25 kg/m2  SpO2 98% Physical Exam  Constitutional: He is oriented to person, place, and time. He appears well-developed and well-nourished. No distress.  HENT:  Head: Normocephalic.  Mouth/Throat: Oropharynx is clear and  moist.  No appreciable swelling to the forehead. No warmth tenderness to palpation  Eyes: Conjunctivae and EOM are normal. Pupils are equal, round, and reactive to light.  Cardiovascular: Normal rate, regular rhythm and intact distal pulses.   Pulmonary/Chest: Effort normal and breath sounds normal. No stridor. No respiratory distress. He has no wheezes. He has no rales. He exhibits no tenderness.  Abdominal: Soft. Bowel sounds are normal. There is no tenderness. There is no rebound and no guarding.  Musculoskeletal: Normal range of motion. He exhibits no edema or tenderness.  Neurological: He is alert and oriented to person, place, and time.  Psychiatric: He has a normal mood and affect.  Nursing note and vitals reviewed.   ED Course  Procedures (including critical care time) Labs Review Labs Reviewed - No data to display  Imaging Review No results found. I have personally reviewed and evaluated these images and lab results as part of my medical decision-making.   EKG Interpretation None      MDM   Final diagnoses:  Localized soft tissue swelling    Filed Vitals:   04/18/15 1227  BP: 132/68  Pulse: 77  Temp: 98.4 F (36.9 C)  TempSrc: Oral  Resp: 16  Height: 6\' 1"  (1.854 m)  Weight: 305 lb (138.347 kg)  SpO2: 98%    Samuel Weeks is a pleasant 23  y.o. male presenting with improving swelling to 4 head. Physical exam is without abnormality. Patient has normal ocular exam, I doubt this is a periorbital cellulitis. Encouraged patient to use Benadryl and apply ice to the affected area  Evaluation does not show pathology that would require ongoing emergent intervention or inpatient treatment. Pt is hemodynamically stable and mentating appropriately. Discussed findings and plan with patient/guardian, who agrees with care plan. All questions answered. Return precautions discussed and outpatient follow up given.       Wynetta Emery, PA-C 04/18/15 1454  Lavera Guise, MD 04/18/15 [phone]

## 2015-04-18 NOTE — ED Notes (Signed)
Pt states small red bump on forehead has gotten progressively more swollen and painful over past 3-4 days.  Pt has taken benadryl, amoxicillan (from an rx from his mother) and ibuprofen and tylenol with minimal relief.

## 2016-08-30 ENCOUNTER — Other Ambulatory Visit: Payer: Self-pay | Admitting: Family Medicine

## 2016-08-30 ENCOUNTER — Ambulatory Visit
Admission: RE | Admit: 2016-08-30 | Discharge: 2016-08-30 | Disposition: A | Discharge status: Home / Self Care | Payer: No Typology Code available for payment source | Source: Ambulatory Visit | Attending: Family Medicine | Admitting: Family Medicine

## 2016-08-30 DIAGNOSIS — Z021 Encounter for pre-employment examination: Secondary | ICD-10-CM

## 2018-05-12 ENCOUNTER — Emergency Department (HOSPITAL_BASED_OUTPATIENT_CLINIC_OR_DEPARTMENT_OTHER)
Admission: EM | Admit: 2018-05-12 | Discharge: 2018-05-12 | Disposition: A | Discharge status: Home / Self Care | Payer: Self-pay | Attending: Emergency Medicine | Admitting: Emergency Medicine

## 2018-05-12 ENCOUNTER — Emergency Department (HOSPITAL_BASED_OUTPATIENT_CLINIC_OR_DEPARTMENT_OTHER): Payer: Self-pay

## 2018-05-12 ENCOUNTER — Other Ambulatory Visit: Payer: Self-pay

## 2018-05-12 ENCOUNTER — Encounter (HOSPITAL_BASED_OUTPATIENT_CLINIC_OR_DEPARTMENT_OTHER): Payer: Self-pay | Admitting: Emergency Medicine

## 2018-05-12 DIAGNOSIS — W500XXA Accidental hit or strike by another person, initial encounter: Secondary | ICD-10-CM | POA: Insufficient documentation

## 2018-05-12 DIAGNOSIS — Y9389 Activity, other specified: Secondary | ICD-10-CM | POA: Insufficient documentation

## 2018-05-12 DIAGNOSIS — F17210 Nicotine dependence, cigarettes, uncomplicated: Secondary | ICD-10-CM | POA: Insufficient documentation

## 2018-05-12 DIAGNOSIS — Y999 Unspecified external cause status: Secondary | ICD-10-CM | POA: Insufficient documentation

## 2018-05-12 DIAGNOSIS — S6992XA Unspecified injury of left wrist, hand and finger(s), initial encounter: Secondary | ICD-10-CM | POA: Insufficient documentation

## 2018-05-12 DIAGNOSIS — Y929 Unspecified place or not applicable: Secondary | ICD-10-CM | POA: Insufficient documentation

## 2018-05-12 MED ORDER — OXYCODONE-ACETAMINOPHEN 5-325 MG PO TABS
1.0000 | ORAL_TABLET | Freq: Once | ORAL | Status: AC
  Administered 2018-05-12: 1 via ORAL
  Filled 2018-05-12: qty 1

## 2018-05-12 MED ORDER — NAPROXEN 500 MG PO TABS: 500.0000 mg | ORAL_TABLET | Freq: Two times a day (BID) | ORAL | 0 refills | Status: AC
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
## 2018-05-12 NOTE — ED Notes (Signed)
Pt/family verbalized understanding of discharge instructions.   
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
## 2018-05-12 NOTE — Discharge Instructions (Addendum)
Please read and follow all provided instructions.  You have been seen today for an injury to your left 3rd finger.   Tests performed today include: An x-ray of the affected area - does NOT show any broken bones or dislocations.  Vital signs. See below for your results today.   Home care instructions: -- *PRICE in the first 24-48 hours after injury: Protect (with brace, splint, sling), if given by your provider Rest Ice- Do not apply ice pack directly to your skin, place towel or similar between your skin and ice/ice pack. Apply ice for 20 min, then remove for 40 min while awake Compression- Wear brace, elastic bandage, splint as directed by your provider Elevate affected extremity above the level of your heart when not walking around for the first 24-48 hours   Medications:  - Naproxen is a nonsteroidal anti-inflammatory medication that will help with pain and swelling. Be sure to take this medication as prescribed with food, 1 pill every 12 hours,  It should be taken with food, as it can cause stomach upset, and more seriously, stomach bleeding. Do not take other nonsteroidal anti-inflammatory medications with this such as Advil, Motrin, Aleve, Mobic, Goodie Powder, or Motrin.    You make take Tylenol per over the counter dosing with these medications.   We have prescribed you new medication(s) today. Discuss the medications prescribed today with your pharmacist as they can have adverse effects and interactions with your other medicines including over the counter and prescribed medications. Seek medical evaluation if you start to experience new or abnormal symptoms after taking one of these medicines, seek care immediately if you start to experience difficulty breathing, feeling of your throat closing, facial swelling, or rash as these could be indications of a more serious allergic reaction   Follow-up instructions: Please follow-up with your hand surgeon within 1 week for re-evaluation.    Return instructions:  Please return if your digits or extremity are numb or tingling, appear gray or blue, or you have severe pain (also elevate the extremity and loosen splint or wrap if you were given one) Please return if you have redness or fevers.  Please return to the Emergency Department if you experience worsening symptoms.  Please return if you have any other emergent concerns. Additional Information:  Your vital signs today were: BP (!) 156/100 (BP Location: Right Arm)    Pulse 96    Temp 98.5 F (36.9 C) (Oral)    Resp 18    Ht 6' (1.829 m)    Wt (!) 167.8 kg    SpO2 97%    BMI 50.18 kg/m  If your blood pressure (BP) was elevated above 135/85 this visit, please have this repeated by your doctor within one month. ---------------

## 2018-12-13 ENCOUNTER — Emergency Department (HOSPITAL_BASED_OUTPATIENT_CLINIC_OR_DEPARTMENT_OTHER)
Admission: EM | Admit: 2018-12-13 | Discharge: 2018-12-13 | Disposition: A | Discharge status: Home / Self Care | Payer: Self-pay | Attending: Emergency Medicine | Admitting: Emergency Medicine

## 2018-12-13 ENCOUNTER — Encounter (HOSPITAL_BASED_OUTPATIENT_CLINIC_OR_DEPARTMENT_OTHER): Payer: Self-pay | Admitting: Emergency Medicine

## 2018-12-13 ENCOUNTER — Other Ambulatory Visit: Payer: Self-pay

## 2018-12-13 ENCOUNTER — Emergency Department (HOSPITAL_BASED_OUTPATIENT_CLINIC_OR_DEPARTMENT_OTHER): Payer: Self-pay

## 2018-12-13 DIAGNOSIS — X58XXXA Exposure to other specified factors, initial encounter: Secondary | ICD-10-CM | POA: Insufficient documentation

## 2018-12-13 DIAGNOSIS — Z79899 Other long term (current) drug therapy: Secondary | ICD-10-CM | POA: Insufficient documentation

## 2018-12-13 DIAGNOSIS — I10 Essential (primary) hypertension: Secondary | ICD-10-CM | POA: Insufficient documentation

## 2018-12-13 DIAGNOSIS — N50811 Right testicular pain: Secondary | ICD-10-CM | POA: Insufficient documentation

## 2018-12-13 DIAGNOSIS — Y929 Unspecified place or not applicable: Secondary | ICD-10-CM | POA: Insufficient documentation

## 2018-12-13 DIAGNOSIS — F17210 Nicotine dependence, cigarettes, uncomplicated: Secondary | ICD-10-CM | POA: Insufficient documentation

## 2018-12-13 DIAGNOSIS — Y999 Unspecified external cause status: Secondary | ICD-10-CM | POA: Insufficient documentation

## 2018-12-13 DIAGNOSIS — N50812 Left testicular pain: Secondary | ICD-10-CM | POA: Insufficient documentation

## 2018-12-13 DIAGNOSIS — S39012A Strain of muscle, fascia and tendon of lower back, initial encounter: Secondary | ICD-10-CM | POA: Insufficient documentation

## 2018-12-13 DIAGNOSIS — Y939 Activity, unspecified: Secondary | ICD-10-CM | POA: Insufficient documentation

## 2018-12-13 LAB — URINALYSIS, ROUTINE W REFLEX MICROSCOPIC
Bilirubin Urine: NEGATIVE
Glucose, UA: NEGATIVE mg/dL
Hgb urine dipstick: NEGATIVE
Ketones, ur: NEGATIVE mg/dL
Leukocytes,Ua: NEGATIVE
Nitrite: NEGATIVE
Protein, ur: NEGATIVE mg/dL
Specific Gravity, Urine: 1.02 (ref 1.005–1.030)
pH: 6.5 (ref 5.0–8.0)

## 2018-12-13 NOTE — ED Notes (Signed)
ED Provider at bedside. 

## 2018-12-13 NOTE — ED Notes (Signed)
Patient transported to Ultrasound 

## 2018-12-13 NOTE — ED Triage Notes (Addendum)
States has been having lower back pain that radiates  to both testicles x 2 days  and concerned because his penis is smaller that usual with out a erection . Denies urinary symptoms

## 2018-12-13 NOTE — ED Provider Notes (Signed)
[facility] EMERGENCY DEPARTMENT Provider Note   CSN: [id number] Arrival date & time: 12/13/18  [id number]    History   Chief Complaint Chief Complaint  Patient presents with  . Testicle Pain    HPI Samuel Weeks is a 28 y.o. male.     28yo M w/ h/o anxiety who p/w back pain and testicular pain.  Patient reports 2 days of intermittent back pain that he states was initially across his lower back but after having sexual intercourse yesterday was more in his middle thoracic back.  It is worse with certain movements.  He took ibuprofen yesterday which relieved the pain but after he took a nap, he woke up and the pain had returned.  No associated urinary symptoms, abdominal pain, vomiting, diarrhea, fevers, leg weakness, or numbness.  Patient reports that the back pain radiates to both testicles.  Yesterday his right testicle was hurting but today it is his left testicle that is causing more pain.  He has not noticed any swelling but states that his genitals "do not look right." He had difficulty clarifying what this means, at one point stated that his penis looks smaller than usual. No penile discharge.  The history is provided by the patient.  Testicle Pain     Past Medical History:  Diagnosis Date  . Anxiety   . Bronchitis     There are no active problems to display for this patient.   Past Surgical History:  Procedure Laterality Date  . HAND SURGERY    . hyperspadis    . TOE SURGERY          Home Medications    Prior to Admission medications   Medication Sig Start Date End Date Taking? Authorizing Provider  naproxen (NAPROSYN) 500 MG tablet Take 1 tablet (500 mg total) by mouth 2 (two) times daily. 05/12/18   Petrucelli, Pleas KochSamantha R, PA-C    Family History No family history on file.  Social History Social History   Tobacco Use  . Smoking status: Current Every Day Smoker    Packs/day: 1.00    Types: Cigarettes  . Smokeless tobacco: Never Used  Substance  Use Topics  . Alcohol use: Yes  . Drug use: Not Currently     Allergies   Patient has no known allergies.   Review of Systems Review of Systems  Genitourinary: Positive for testicular pain.   All other systems reviewed and are negative except that which was mentioned in HPI   Physical Exam Updated Vital Signs BP (!) 161/102 (BP Location: Right Arm)   Pulse 77   Temp 98.7 F (37.1 C) (Oral)   Resp 18   Ht 6' (1.829 m)   Wt (!) 165.6 kg   SpO2 95%   BMI 49.50 kg/m   Physical Exam Vitals signs and nursing note reviewed. Exam conducted with a chaperone present.  Constitutional:      General: He is not in acute distress.    Appearance: He is well-developed.  HENT:     Head: Normocephalic and atraumatic.  Eyes:     Conjunctiva/sclera: Conjunctivae normal.  Neck:     Musculoskeletal: Neck supple.  Abdominal:     General: Abdomen is flat. There is no distension.     Palpations: Abdomen is soft.     Tenderness: There is no abdominal tenderness.     Hernia: There is no hernia in the right inguinal area or left inguinal area.  Genitourinary:    Pubic Area: No  rash.      Penis: Normal and circumcised.      Scrotum/Testes: Normal.        Right: Swelling not present.        Left: Swelling not present.     Epididymis:     Right: Not enlarged.     Left: Not enlarged.  Musculoskeletal:        General: No swelling.     Comments: No focal back tenderness of midline spine or paraspinal muscles  Skin:    General: Skin is warm and dry.  Neurological:     Mental Status: He is alert and oriented to person, place, and time.     Sensory: No sensory deficit.     Motor: No weakness.     Gait: Gait normal.     Comments: Normal gait, normal strength BLE  Psychiatric:        Judgment: Judgment normal.      ED Treatments / Results  Labs (all labs ordered are listed, but only abnormal results are displayed) Labs Reviewed  URINALYSIS, ROUTINE W REFLEX MICROSCOPIC   GC/CHLAMYDIA PROBE AMP ([place]) NOT AT [facility]    EKG None  Radiology US Scrotum W/doppler  Result Date: 12/13/2018 CLINICAL DATA:  Bilateral testicular pain, left more than right EXAM: SCROTAL ULTRASOUND DOPPLER ULTRASOUND OF THE TESTICLES TECHNIQUE: Complete ultrasound examination of the testicles, epididymis, and other scrotal structures was performed. Color and spectral Doppler ultrasound were also utilized to evaluate blood flow to the testicles. COMPARISON:  None. FINDINGS: Right testicle Measurements: 45 x 23 x 27 mm. No mass or microlithiasis visualized. Left testicle Measurements: 44 x 22 x 29 mm. No mass or microlithiasis visualized. Right epididymis:  Normal in size and appearance. Left epididymis:  Normal in size and appearance. Hydrocele:  None visualized. Varicocele:  None visualized. Pulsed Doppler interrogation of both testes demonstrates normal low resistance arterial and venous waveforms bilaterally. IMPRESSION: Normal testicular ultrasound. Electronically Signed   By: Marnee Spring M.D.   On: 12/13/2018 10:45    Procedures Procedures (including critical care time)  Medications Ordered in ED Medications - No data to display   Initial Impression / Assessment and Plan / ED Course  I have reviewed the triage vital signs and the nursing notes.  Pertinent labs & imaging results that were available during my care of the patient were reviewed by me and considered in my medical decision making (see chart for details).        I did not appreciate any abnormalities on testicular exam, however given reports of pain, obtained US to evaluate blood flow. No physical exam findings suggestive of hernia. Back pain not suggestive of kidney stone or pyelonephritis, UA normal. Testicular US normal b/l. Suspect musculoskeletal pain 2/2 back strain. No signs/sx of cauda equina or infectious process. Denies IVDU.  Discussed supportive measures including scheduled ibuprofen, no heavy  lifting, and range of motion exercises. Also informed pt of elevated BP here and need for PCP f/u as he may need medication. Return precautions reviewed.  Final Clinical Impressions(s) / ED Diagnoses   Final diagnoses:  Back strain, initial encounter  Hypertension, unspecified type    ED Discharge Orders    None       , Ambrose Finland, MD 12/13/18 1058

## 2018-12-14 LAB — GC/CHLAMYDIA PROBE AMP (~~LOC~~) NOT AT ARMC
Chlamydia: NEGATIVE
Neisseria Gonorrhea: NEGATIVE

## 2021-02-25 ENCOUNTER — Emergency Department (HOSPITAL_BASED_OUTPATIENT_CLINIC_OR_DEPARTMENT_OTHER): Payer: Self-pay

## 2021-02-25 ENCOUNTER — Encounter (HOSPITAL_BASED_OUTPATIENT_CLINIC_OR_DEPARTMENT_OTHER): Payer: Self-pay

## 2021-02-25 ENCOUNTER — Emergency Department (HOSPITAL_BASED_OUTPATIENT_CLINIC_OR_DEPARTMENT_OTHER)
Admission: EM | Admit: 2021-02-25 | Discharge: 2021-02-25 | Disposition: A | Discharge status: Home / Self Care | Payer: Self-pay | Attending: Emergency Medicine | Admitting: Emergency Medicine

## 2021-02-25 ENCOUNTER — Other Ambulatory Visit: Payer: Self-pay

## 2021-02-25 DIAGNOSIS — R200 Anesthesia of skin: Secondary | ICD-10-CM | POA: Insufficient documentation

## 2021-02-25 DIAGNOSIS — G43809 Other migraine, not intractable, without status migrainosus: Secondary | ICD-10-CM | POA: Insufficient documentation

## 2021-02-25 DIAGNOSIS — M542 Cervicalgia: Secondary | ICD-10-CM | POA: Insufficient documentation

## 2021-02-25 DIAGNOSIS — M25512 Pain in left shoulder: Secondary | ICD-10-CM | POA: Insufficient documentation

## 2021-02-25 DIAGNOSIS — F17210 Nicotine dependence, cigarettes, uncomplicated: Secondary | ICD-10-CM | POA: Insufficient documentation

## 2021-02-25 LAB — CBC WITH DIFFERENTIAL/PLATELET
Abs Immature Granulocytes: 0.01 10*3/uL (ref 0.00–0.07)
Basophils Absolute: 0 10*3/uL (ref 0.0–0.1)
Basophils Relative: 1 %
Eosinophils Absolute: 0.1 10*3/uL (ref 0.0–0.5)
Eosinophils Relative: 1 %
HCT: 45 % (ref 39.0–52.0)
Hemoglobin: 15.6 g/dL (ref 13.0–17.0)
Immature Granulocytes: 0 %
Lymphocytes Relative: 43 %
Lymphs Abs: 2.1 10*3/uL (ref 0.7–4.0)
MCH: 29.4 pg (ref 26.0–34.0)
MCHC: 34.7 g/dL (ref 30.0–36.0)
MCV: 84.9 fL (ref 80.0–100.0)
Monocytes Absolute: 0.3 10*3/uL (ref 0.1–1.0)
Monocytes Relative: 6 %
Neutro Abs: 2.4 10*3/uL (ref 1.7–7.7)
Neutrophils Relative %: 49 %
Platelets: 234 10*3/uL (ref 150–400)
RBC: 5.3 MIL/uL (ref 4.22–5.81)
RDW: 13 % (ref 11.5–15.5)
WBC: 5 10*3/uL (ref 4.0–10.5)
nRBC: 0 % (ref 0.0–0.2)

## 2021-02-25 LAB — COMPREHENSIVE METABOLIC PANEL
ALT: 39 U/L (ref 0–44)
AST: 22 U/L (ref 15–41)
Albumin: 4 g/dL (ref 3.5–5.0)
Alkaline Phosphatase: 71 U/L (ref 38–126)
Anion gap: 7 (ref 5–15)
BUN: 14 mg/dL (ref 6–20)
CO2: 26 mmol/L (ref 22–32)
Calcium: 9.1 mg/dL (ref 8.9–10.3)
Chloride: 106 mmol/L (ref 98–111)
Creatinine, Ser: 1.03 mg/dL (ref 0.61–1.24)
GFR, Estimated: >60 mL/min (ref >60)
Glucose, Bld: 143 mg/dL — ABNORMAL HIGH (ref 70–99)
Potassium: 3.8 mmol/L (ref 3.5–5.1)
Sodium: 139 mmol/L (ref 135–145)
Total Bilirubin: 0.8 mg/dL (ref 0.3–1.2)
Total Protein: 7.4 g/dL (ref 6.5–8.1)

## 2021-02-25 LAB — TROPONIN I (HIGH SENSITIVITY): Troponin I (High Sensitivity): 4 ng/L (ref <18)

## 2021-02-25 MED ORDER — METOCLOPRAMIDE HCL 5 MG/ML IJ SOLN
10.0000 mg | Freq: Once | INTRAMUSCULAR | Status: AC
  Administered 2021-02-25: 10 mg via INTRAVENOUS
  Filled 2021-02-25: qty 2

## 2021-02-25 MED ORDER — IOHEXOL 350 MG/ML SOLN
100.0000 mL | Freq: Once | INTRAVENOUS | Status: AC | PRN
  Administered 2021-02-25: 100 mL via INTRAVENOUS

## 2021-02-25 NOTE — ED Notes (Signed)
ED Provider at bedside. 

## 2021-02-25 NOTE — ED Notes (Signed)
Patient transported to CT 

## 2021-02-25 NOTE — ED Notes (Signed)
No second trop per MD [facility]

## 2021-02-25 NOTE — ED Provider Notes (Signed)
[facility] EMERGENCY DEPARTMENT Provider Note   CSN: [id number] Arrival date & time: 02/25/21  0725     History Chief Complaint  Patient presents with   Headache    Samuel Weeks is a 30 y.o. male.  Patient is a 30 year old male with a history of rapid heart rate had been taking metoprolol, tobacco use and anxiety who is presenting today with multiple symptoms.  Patient reports that 2 days ago he started to get a right-sided headache when he was fussing at his son.  It was dull and aching in nature and felt similar to prior headaches he has had in the last 3 to 4 months.  The only thing different about this headache is that after taking Excedrin Migraine it only relieved his symptoms for 30 minutes and then the headache returned.  Yesterday he woke up around 8 AM and had a headache again and took Excedrin Migraine which did improve the headache throughout the morning but around 3:00 the headache came back.  He took some more Excedrin around 5:00 last night but as the night progressed he started to get pain in the left side of his neck that then caused pain in his left arm and leg with some heaviness and tingling.  The headache has persisted and he is not taking anything else for it.  He has had no nausea or vomiting.  He has had pain in his left shoulder blade but no frontal chest pain or shortness of breath.  He has not had cough, congestion or fever.  No abdominal pain or difficulty ambulating.  Denies any speech changes.  He did have a prior history of atrial fibrillation and had been placed on metoprolol but 3 weeks ago he discontinued the metoprolol because it made his heart rate so slow and made him difficult to arouse that he and his wife decided he should not take it anymore.  He went to [facility] this morning and the wait was very long.  They gave him Tylenol which she reports has improved his headache and is now down to a 2 or 3 but he left without being seen.  No  symptoms of this happening before or similar.  The history is provided by the patient.  Headache     Past Medical History:  Diagnosis Date   Anxiety    Bronchitis     There are no problems to display for this patient.   Past Surgical History:  Procedure Laterality Date   HAND SURGERY     hyperspadis     TOE SURGERY         No family history on file.  Social History   Tobacco Use   Smoking status: Every Day    Packs/day: 1.00    Types: Cigarettes   Smokeless tobacco: Never  Vaping Use   Vaping Use: Never used  Substance Use Topics   Alcohol use: Not Currently   Drug use: Not Currently    Home Medications Prior to Admission medications   Medication Sig Start Date End Date Taking? Authorizing Provider  naproxen (NAPROSYN) 500 MG tablet Take 1 tablet (500 mg total) by mouth 2 (two) times daily. 05/12/18   Petrucelli, Pleas KochSamantha R, PA-C    Allergies    Patient has no known allergies.  Review of Systems   Review of Systems  Neurological:  Positive for headaches.  All other systems reviewed and are negative.  Physical Exam Updated Vital Signs BP (!) 142/56 (BP  Location: Right Arm)   Pulse 68   Temp 98 F (36.7 C) (Oral)   Resp 20   Ht 6' (1.829 m)   Wt (!) 163.3 kg   SpO2 98%   BMI 48.82 kg/m   Physical Exam Vitals and nursing note reviewed.  Constitutional:      General: He is not in acute distress.    Appearance: He is well-developed. He is obese.  HENT:     Head: Normocephalic and atraumatic.  Eyes:     Conjunctiva/sclera: Conjunctivae normal.     Pupils: Pupils are equal, round, and reactive to light.  Neck:   Cardiovascular:     Rate and Rhythm: Normal rate and regular rhythm.     Pulses: Normal pulses.     Heart sounds: No murmur heard. Pulmonary:     Effort: Pulmonary effort is normal. No respiratory distress.     Breath sounds: Normal breath sounds. No wheezing or rales.  Abdominal:     General: There is no distension.      Palpations: Abdomen is soft.     Tenderness: There is no abdominal tenderness. There is no guarding or rebound.  Musculoskeletal:        General: No tenderness. Normal range of motion.     Cervical back: Normal range of motion and neck supple. Muscular tenderness present. No spinous process tenderness.     Right lower leg: No edema.     Left lower leg: No edema.  Skin:    General: Skin is warm and dry.     Findings: No erythema or rash.  Neurological:     General: No focal deficit present.     Mental Status: He is alert and oriented to person, place, and time. Mental status is at baseline.     Cranial Nerves: No cranial nerve deficit.     Sensory: No sensory deficit.     Motor: No weakness.     Coordination: Coordination normal.     Gait: Gait normal.     Comments: No pronator drift noted but appeared to have slightly more effort with lifting the left leg.  Psychiatric:        Mood and Affect: Mood normal.        Behavior: Behavior normal.    ED Results / Procedures / Treatments   Labs (all labs ordered are listed, but only abnormal results are displayed) Labs Reviewed  COMPREHENSIVE METABOLIC PANEL - Abnormal; Notable for the following components:      Result Value   Glucose, Bld 143 (*)    All other components within normal limits  CBC WITH DIFFERENTIAL/PLATELET  TROPONIN I (HIGH SENSITIVITY)  TROPONIN I (HIGH SENSITIVITY)    EKG EKG Interpretation  Date/Time:  Thursday February 25 2021 07:51:29 EDT Ventricular Rate:  68 PR Interval:  144 QRS Duration: 109 QT Interval:  386 QTC Calculation: 411 R Axis:   48 Text Interpretation: Sinus rhythm Low voltage, precordial leads RSR' in V1 or V2, right VCD or RVH No previous tracing Confirmed by Gwyneth Sprout ([id number]) on 02/25/2021 8:14:39 AM  Radiology CT Angio Head W or Wo Contrast  Result Date: 02/25/2021 CLINICAL DATA:  Neuro deficit, acute, stroke suspected. Additional provided: Patient reports headache for 3 days,  left-sided neck and body tingling and numbness. EXAM: CT ANGIOGRAPHY HEAD AND NECK TECHNIQUE: Multidetector CT imaging of the head and neck was performed using the standard protocol during bolus administration of intravenous contrast. Multiplanar CT image reconstructions and MIPs were  obtained to evaluate the vascular anatomy. Carotid stenosis measurements (when applicable) are obtained utilizing NASCET criteria, using the distal internal carotid diameter as the denominator. CONTRAST:  OMNIPAQUE IOHEXOL 350 MG/ML SOLN COMPARISON:  No pertinent prior exams available for comparison. FINDINGS: CT HEAD FINDINGS Brain: Cerebral volume is normal. There is no acute intracranial hemorrhage. No demarcated cortical infarct. No extra-axial fluid collection. No evidence of an intracranial mass. No midline shift. Vascular: No hyperdense vessel. Skull: Normal. Negative for fracture or focal lesion. Sinuses: Mild bilateral ethmoid sinus mucosal thickening. Orbits: No mass or acute finding. Review of the MIP images confirms the above findings CTA NECK FINDINGS Aortic arch: Standard aortic branching. The visualized aortic arch is normal in caliber. Streak and beam hardening artifact arising from a dense right-sided contrast bolus partially obscures the innominate and proximal right subclavian arteries. Within this limitation, there is no appreciable hemodynamically significant stenosis of the innominate or proximal subclavian arteries. Right carotid system: Streak and beam hardening artifact arising from a dense right-sided contrast bolus partially obscures the proximal common carotid artery. Within this limitation, the CCA and ICA are patent within the neck without stenosis or significant atherosclerotic disease. Left carotid system: CCA and ICA patent within the neck without stenosis or significant atherosclerotic disease. Vertebral arteries: Patent within the neck. The left vertebral artery is slightly dominant. Skeleton:  No acute bony abnormality or aggressive osseous lesion. Other neck: Nonspecific prominence of the adenoids/nasopharyngeal soft tissues. No cervical lymphadenopathy. Upper chest: No consolidation within the imaged lung apices. Review of the MIP images confirms the above findings CTA HEAD FINDINGS Anterior circulation: The intracranial internal carotid arteries are patent. The M1 middle cerebral arteries are patent. No M2 proximal branch occlusion or high-grade proximal stenosis is identified. The anterior cerebral arteries are patent. No intracranial aneurysm is identified. Posterior circulation: The intracranial vertebral arteries are patent. Mild nonspecific fusiform dilation of the proximal V4 left vertebral artery. The basilar artery is patent. The posterior cerebral arteries are patent. Posterior communicating arteries are hypoplastic or absent bilaterally. Venous sinuses: Within the limitations of contrast timing, no convincing thrombus. Anatomic variants: As described Review of the MIP images confirms the above findings IMPRESSION: CT head: No evidence of acute intracranial abnormality. CTA neck: 1. Streak and beam hardening artifact arising from a dense right-sided contrast bolus partially obscures the innominate artery, proximal right subclavian artery and proximal right common carotid artery. 2. Within this limitation, the bilateral common and internal carotid arteries are patent within the neck without stenosis. 3. The vertebral arteries are patent within the neck without stenosis. 4. Nonspecific prominence of the nasopharyngeal/adenoid soft tissues with significant effacement of the nasopharynx. Nonemergent ENT consultation recommended. CTA head: 1. No intracranial large vessel occlusion or proximal high-grade arterial stenosis. 2. Mild nonspecific fusiform dilation of the proximal V4 left vertebral artery. Electronically Signed   By: Jackey Loge DO   On: 02/25/2021 09:43   CT Angio Neck W and/or Wo  Contrast  Result Date: 02/25/2021 CLINICAL DATA:  Neuro deficit, acute, stroke suspected. Additional provided: Patient reports headache for 3 days, left-sided neck and body tingling and numbness. EXAM: CT ANGIOGRAPHY HEAD AND NECK TECHNIQUE: Multidetector CT imaging of the head and neck was performed using the standard protocol during bolus administration of intravenous contrast. Multiplanar CT image reconstructions and MIPs were obtained to evaluate the vascular anatomy. Carotid stenosis measurements (when applicable) are obtained utilizing NASCET criteria, using the distal internal carotid diameter as the denominator. CONTRAST:  OMNIPAQUE IOHEXOL  350 MG/ML SOLN COMPARISON:  No pertinent prior exams available for comparison. FINDINGS: CT HEAD FINDINGS Brain: Cerebral volume is normal. There is no acute intracranial hemorrhage. No demarcated cortical infarct. No extra-axial fluid collection. No evidence of an intracranial mass. No midline shift. Vascular: No hyperdense vessel. Skull: Normal. Negative for fracture or focal lesion. Sinuses: Mild bilateral ethmoid sinus mucosal thickening. Orbits: No mass or acute finding. Review of the MIP images confirms the above findings CTA NECK FINDINGS Aortic arch: Standard aortic branching. The visualized aortic arch is normal in caliber. Streak and beam hardening artifact arising from a dense right-sided contrast bolus partially obscures the innominate and proximal right subclavian arteries. Within this limitation, there is no appreciable hemodynamically significant stenosis of the innominate or proximal subclavian arteries. Right carotid system: Streak and beam hardening artifact arising from a dense right-sided contrast bolus partially obscures the proximal common carotid artery. Within this limitation, the CCA and ICA are patent within the neck without stenosis or significant atherosclerotic disease. Left carotid system: CCA and ICA patent within the neck without  stenosis or significant atherosclerotic disease. Vertebral arteries: Patent within the neck. The left vertebral artery is slightly dominant. Skeleton: No acute bony abnormality or aggressive osseous lesion. Other neck: Nonspecific prominence of the adenoids/nasopharyngeal soft tissues. No cervical lymphadenopathy. Upper chest: No consolidation within the imaged lung apices. Review of the MIP images confirms the above findings CTA HEAD FINDINGS Anterior circulation: The intracranial internal carotid arteries are patent. The M1 middle cerebral arteries are patent. No M2 proximal branch occlusion or high-grade proximal stenosis is identified. The anterior cerebral arteries are patent. No intracranial aneurysm is identified. Posterior circulation: The intracranial vertebral arteries are patent. Mild nonspecific fusiform dilation of the proximal V4 left vertebral artery. The basilar artery is patent. The posterior cerebral arteries are patent. Posterior communicating arteries are hypoplastic or absent bilaterally. Venous sinuses: Within the limitations of contrast timing, no convincing thrombus. Anatomic variants: As described Review of the MIP images confirms the above findings IMPRESSION: CT head: No evidence of acute intracranial abnormality. CTA neck: 1. Streak and beam hardening artifact arising from a dense right-sided contrast bolus partially obscures the innominate artery, proximal right subclavian artery and proximal right common carotid artery. 2. Within this limitation, the bilateral common and internal carotid arteries are patent within the neck without stenosis. 3. The vertebral arteries are patent within the neck without stenosis. 4. Nonspecific prominence of the nasopharyngeal/adenoid soft tissues with significant effacement of the nasopharynx. Nonemergent ENT consultation recommended. CTA head: 1. No intracranial large vessel occlusion or proximal high-grade arterial stenosis. 2. Mild nonspecific fusiform  dilation of the proximal V4 left vertebral artery. Electronically Signed   By: Jackey Loge DO   On: 02/25/2021 09:43    Procedures Procedures   Medications Ordered in ED Medications - No data to display  ED Course  I have reviewed the triage vital signs and the nursing notes.  Pertinent labs & imaging results that were available during my care of the patient were reviewed by me and considered in my medical decision making (see chart for details).    MDM Rules/Calculators/A&P                           30 year old male presenting today with complaints of headache that then progressed to neck pain and left-sided pain in the arm and leg around 11 PM last night.  Patient displays no significant neurologic abnormalities here today.  May be slightly more effort with lifting the left leg but no notable weakness.  Given patient's history of 2 days of headache that then moved into his neck and his left side concern for possible dissection or aneurysm.  Lower suspicion for venous thrombosis.  Could be migraine.  Lower suspicion for cardiac abnormality as he has had no chest pain or shortness of breath.  History of prior atrial fibrillation but no other known heart issues.  Patient has been off metoprolol for 3 weeks due to to not liking the way it made him feel.  However patient has a heart rate in the 60s today with blood pressure 117/57.  Low suspicion for acute abdominal pathology as patient has no complaints there.  EKG today shows nonspecific T wave changes in V5 and V6 but no old to compare.  Labs and imaging pending.  Patient had Tylenol at [facility] today and reports headache is down to a 2 or 3.  Will give Reglan to see if we can make the headache go away completely.  No specific covid like sx today.  10:13 AM Patient's labs are reassuring with normal troponin, CMP and CBC.  CTA was negative for large vessel occlusion or high-grade stenosis, he did have anatomic variant, also had  evidence of nasopharyngeal adenoid soft tissue swelling that recommended nonemergent ENT consultation.  Patient does snore and reports occasional allergies.  On repeat evaluation he is feeling better.  Low suspicion at this time for dissection or stroke.  We will discharge patient home possible atypical migraine.  MDM   Amount and/or Complexity of Data Reviewed Clinical lab tests: ordered and reviewed Tests in the radiology section of CPT: ordered and reviewed Tests in the medicine section of CPT: reviewed and ordered Independent visualization of images, tracings, or specimens: yes     Final Clinical Impression(s) / ED Diagnoses Final diagnoses:  Other migraine without status migrainosus, not intractable    Rx / DC Orders ED Discharge Orders     None        Gwyneth Sprout, MD 02/25/21 1016

## 2021-02-25 NOTE — Discharge Instructions (Addendum)
All the lab work looks normal today without any signs of any heart problems, kidney problems and no evidence of stroke or aneurysm.  You can take Excedrin as needed for the headache or Tylenol.  If it continues you should follow-up with your regular doctor or if it becomes worse and you are having vomiting, trouble with your vision, speech or trouble walking you should return to the ER immediately.

## 2021-02-25 NOTE — ED Triage Notes (Addendum)
Pt arrives with c/o headache starting a few days ago states that last night he started having left sided pain in hip and knee. Pt reports he is supposed to be on metoprolol but has not been taking reports he was told he had A Fib.

## 2021-11-20 IMAGING — CT CT ANGIO NECK
1 of 8 series · 6 of 33 positions shown · IV contrast (Omnipaque)
Comparison: No pertinent prior exams available for comparison.

CLINICAL DATA: Neuro deficit, acute, stroke suspected. Additional
provided: Patient reports headache for 3 days, left-sided neck and
body tingling and numbness.

EXAM:
CT ANGIOGRAPHY HEAD AND NECK
TECHNIQUE: Multidetector CT imaging of the head and neck was performed using
the standard protocol during bolus administration of intravenous
contrast. Multiplanar CT image reconstructions and MIPs were
obtained to evaluate the vascular anatomy. Carotid stenosis
measurements (when applicable) are obtained utilizing NASCET
criteria, using the distal internal carotid diameter as the
denominator.
CONTRAST:  100mL OMNIPAQUE IOHEXOL 350 MG/ML SOLN

[Series 8: axial thin · axial · 0.46mm/px · z∈[-379,-98]mm · 6 of 395 slices shown]
[im 57/395  soft-tissue]
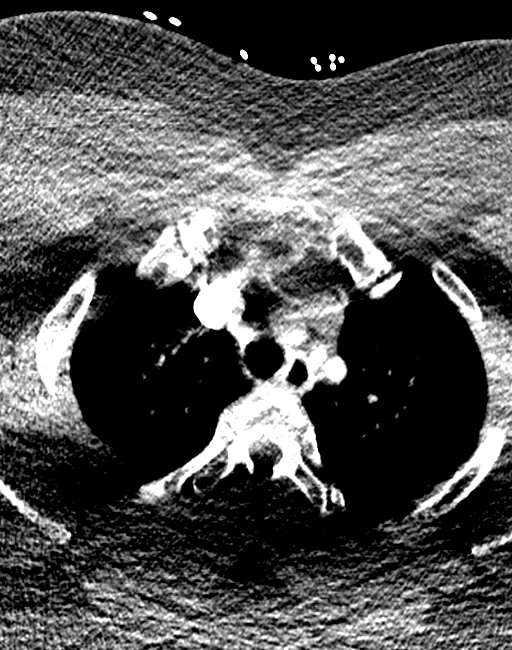
[im 113/395  bone]
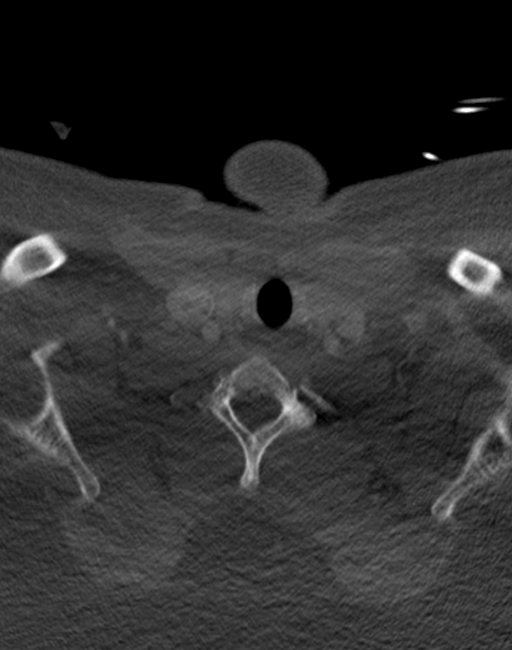
[im 169/395  soft-tissue]
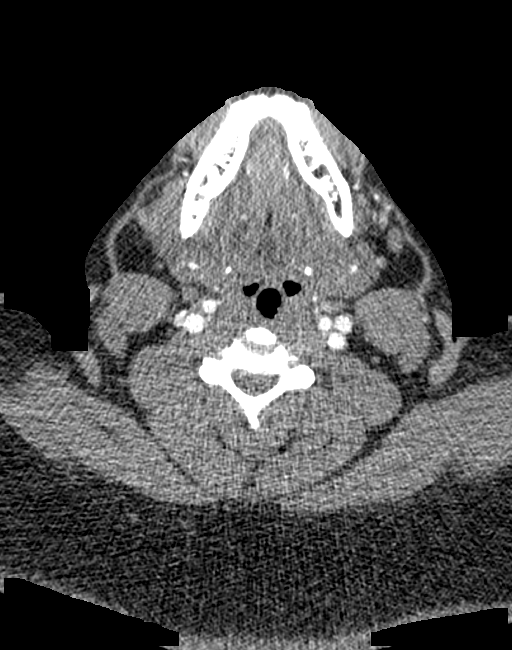
[im 226/395  bone]
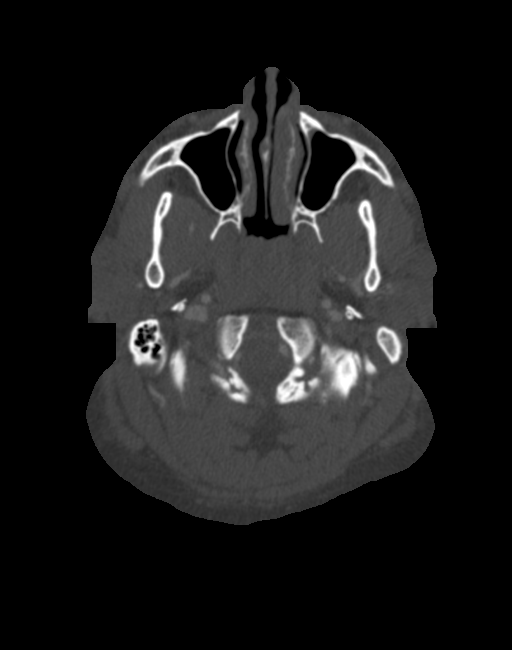
[im 282/395  soft-tissue]
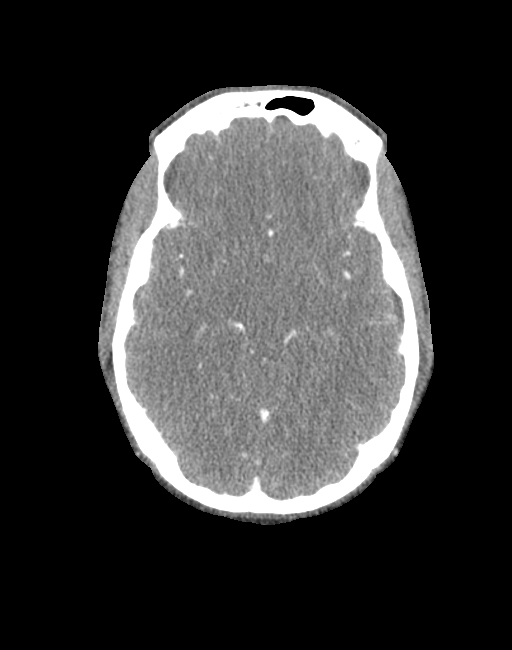
[im 338/395  bone]
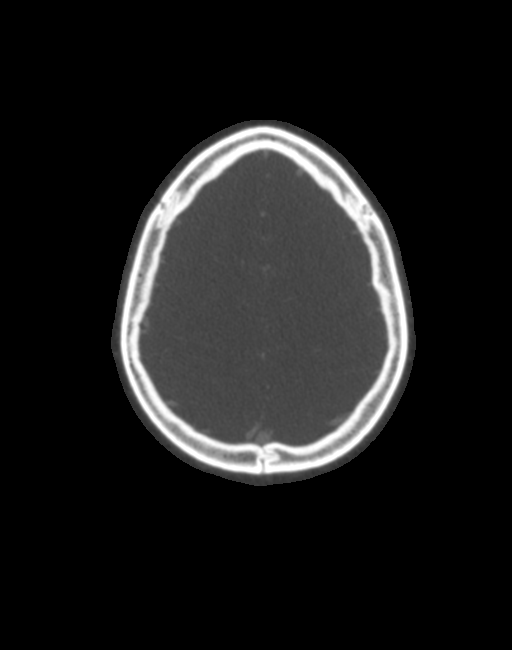

[6 of 33 positions shown; findings below may reference images not displayed]

FINDINGS: CT HEAD FINDINGS

Brain:

Cerebral volume is normal.

There is no acute intracranial hemorrhage.

No demarcated cortical infarct.

No extra-axial fluid collection.

No evidence of an intracranial mass.

No midline shift.

Vascular: No hyperdense vessel.

Skull: Normal. Negative for fracture or focal lesion.

Sinuses: Mild bilateral ethmoid sinus mucosal thickening.

Orbits: No mass or acute finding.

Review of the MIP images confirms the above findings

CTA NECK FINDINGS

Aortic arch: Standard aortic branching. The visualized aortic arch
is normal in caliber. Streak and beam hardening artifact arising
from a dense right-sided contrast bolus partially obscures the
innominate and proximal right subclavian arteries. Within this
limitation, there is no appreciable hemodynamically significant
stenosis of the innominate or proximal subclavian arteries.

Right carotid system: Streak and beam hardening artifact arising
from a dense right-sided contrast bolus partially obscures the
proximal common carotid artery. Within this limitation, the CCA and
ICA are patent within the neck without stenosis or significant
atherosclerotic disease.

Left carotid system: CCA and ICA patent within the neck without
stenosis or significant atherosclerotic disease.

Vertebral arteries: Patent within the neck. The left vertebral
artery is slightly dominant.

Skeleton: No acute bony abnormality or aggressive osseous lesion.

Other neck: Nonspecific prominence of the adenoids/nasopharyngeal
soft tissues. No cervical lymphadenopathy.

Upper chest: No consolidation within the imaged lung apices.

Review of the MIP images confirms the above findings

CTA HEAD FINDINGS

Anterior circulation:

The intracranial internal carotid arteries are patent. The M1 middle
cerebral arteries are patent. No M2 proximal branch occlusion or
high-grade proximal stenosis is identified. The anterior cerebral
arteries are patent. No intracranial aneurysm is identified.

Posterior circulation:

The intracranial vertebral arteries are patent. Mild nonspecific
fusiform dilation of the proximal V4 left vertebral artery. The
basilar artery is patent. The posterior cerebral arteries are
patent. Posterior communicating arteries are hypoplastic or absent
bilaterally.

Venous sinuses: Within the limitations of contrast timing, no
convincing thrombus.

Anatomic variants: As described

Review of the MIP images confirms the above findings
IMPRESSION: CT head:

No evidence of acute intracranial abnormality.

CTA neck:

1. Streak and beam hardening artifact arising from a dense
right-sided contrast bolus partially obscures the innominate artery,
proximal right subclavian artery and proximal right common carotid
artery.
2. Within this limitation, the bilateral common and internal carotid
arteries are patent within the neck without stenosis.
3. The vertebral arteries are patent within the neck without
stenosis.
4. Nonspecific prominence of the nasopharyngeal/adenoid soft tissues
with significant effacement of the nasopharynx. Nonemergent ENT
consultation recommended.

CTA head:

1. No intracranial large vessel occlusion or proximal high-grade
arterial stenosis.
2. Mild nonspecific fusiform dilation of the proximal V4 left
vertebral artery.
